# Patient Record
Sex: Female | Born: 1990 | Race: Black or African American | Hispanic: No | Marital: Single | Smoking: Never smoker
Health system: Southern US, Community
[De-identification: ages and names within clinical notes are randomized; demographics above are authoritative.]

## PROBLEM LIST (undated history)

## (undated) DIAGNOSIS — B009 Herpesviral infection, unspecified: Secondary | ICD-10-CM

## (undated) HISTORY — PX: THERAPEUTIC ABORTION: SHX798

## (undated) HISTORY — PX: NO PAST SURGERIES: SHX2092

---
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
## 2013-08-08 ENCOUNTER — Inpatient Hospital Stay (HOSPITAL_COMMUNITY)
Admission: AD | Admit: 2013-08-08 | Discharge: 2013-08-08 | Disposition: A | Discharge status: Home / Self Care | Payer: 59 | Source: Ambulatory Visit | Attending: Obstetrics and Gynecology | Admitting: Obstetrics and Gynecology

## 2013-08-08 ENCOUNTER — Encounter (HOSPITAL_COMMUNITY): Payer: Self-pay | Admitting: *Deleted

## 2013-08-08 DIAGNOSIS — A499 Bacterial infection, unspecified: Secondary | ICD-10-CM | POA: Insufficient documentation

## 2013-08-08 DIAGNOSIS — B009 Herpesviral infection, unspecified: Secondary | ICD-10-CM

## 2013-08-08 DIAGNOSIS — A6000 Herpesviral infection of urogenital system, unspecified: Secondary | ICD-10-CM | POA: Insufficient documentation

## 2013-08-08 DIAGNOSIS — N760 Acute vaginitis: Secondary | ICD-10-CM | POA: Insufficient documentation

## 2013-08-08 DIAGNOSIS — B9689 Other specified bacterial agents as the cause of diseases classified elsewhere: Secondary | ICD-10-CM | POA: Insufficient documentation

## 2013-08-08 DIAGNOSIS — R109 Unspecified abdominal pain: Secondary | ICD-10-CM | POA: Insufficient documentation

## 2013-08-08 LAB — URINALYSIS, ROUTINE W REFLEX MICROSCOPIC
Bilirubin Urine: NEGATIVE
Glucose, UA: NEGATIVE mg/dL
Ketones, ur: NEGATIVE mg/dL
Leukocytes, UA: NEGATIVE
Nitrite: NEGATIVE
Protein, ur: NEGATIVE mg/dL
Specific Gravity, Urine: 1.025 (ref 1.005–1.030)
Urobilinogen, UA: 0.2 mg/dL (ref 0.0–1.0)
pH: 6 (ref 5.0–8.0)

## 2013-08-08 LAB — URINE MICROSCOPIC-ADD ON

## 2013-08-08 LAB — WET PREP, GENITAL
Trich, Wet Prep: NONE SEEN
Yeast Wet Prep HPF POC: NONE SEEN

## 2013-08-08 LAB — POCT PREGNANCY, URINE: Preg Test, Ur: NEGATIVE

## 2013-08-08 MED ORDER — AZITHROMYCIN 500 MG PO TABS: 1000.0000 mg | ORAL_TABLET | Freq: Once | ORAL | Status: AC

## 2013-08-08 MED ORDER — VALACYCLOVIR HCL 1 G PO TABS: 1000.0000 mg | ORAL_TABLET | Freq: Two times a day (BID) | ORAL | Status: AC

## 2013-08-08 MED ORDER — METRONIDAZOLE 500 MG PO TABS: 500.0000 mg | ORAL_TABLET | Freq: Two times a day (BID) | ORAL | Status: AC

## 2013-08-08 MED ORDER — LIDOCAINE HCL 2 % EX GEL: CUTANEOUS | Status: DC

## 2013-08-08 NOTE — MAU Note (Signed)
Patient presents with left sided groin pain X 2 days.

## 2013-08-08 NOTE — MAU Provider Note (Signed)
  History     CSN: [id number]  Arrival date and time: 08/08/13 2015      Chief Complaint  Patient presents with  . Groin Pain   HPI Pt reports vaginal tenderness since Monday, which has developed into severe pain.  States she went to [facility] yesterday, but they were unable to perform a speculum exam due to pain 10/10.  They sent labs for STD screen, but she is unsure which were included and they gave her a Rx for valtrex, which she has not filled.  Also states she has a painful lump on her left groin since last night and vaginal spotting for a few days and moderate thin d/c.  Denies fever, chills, HA, thick vaginal d/c or itching.  Pt states that she has been having sex for 2 yrs with an on-again-off-again partner.  She denies taking anything for pain. LMP 2 weeks ago per pt.   Past Medical History  Diagnosis Date  . Medical history non-contributory   HPV+  Past Surgical History  Procedure Laterality Date  . No past surgeries      Family History  Problem Relation Age of Onset  . Hypertension Mother   . Depression Mother   . Hypertension Father   . Depression Father     History  Substance Use Topics  . Smoking status: Never Smoker   . Smokeless tobacco: Never Used  . Alcohol Use: Yes    Allergies: No Known Allergies  No prescriptions prior to admission    ROS GU + for vaginal spotting, thin d/c with possible odor, severe vaginal and pelvic pain/burning.  Denies abd pain, urinary urgency/frequency, fever, chills, HA, vaginal itching.   Physical Exam   Blood pressure 125/71, pulse 94, temperature 97.8 F (36.6 C), temperature source Oral, resp. rate 16, height 5' (1.524 m), weight 49.159 kg (108 lb 6 oz), last menstrual period 07/25/2013.  Physical Exam Gen--Visibly uncomfortable Abd--Non-tender, non-distended Groin--Left inguinal lymphadenopathy and tenderness Ext genitalia--~1cm red lesion under introitus, painful to  palpation Vagina--multiple vesicles noted w/ copious, foul-smelling d/c and scant bleeding; tender to palpation   MAU Course  Procedures UPT neg GC/Chlam Wet prep--moderate clue cells  Waiting on records from urgent care Assessment and Plan  1.) HSV   Valtrex x 7-10 days  Topical lidocaine gel prn  Encourage supportive measures--soaking in the tub, using water w/ urination for dilution  2.) Bacterial vaginosis  Metronidazole 500 mg x 7 days  3.) Multiple STDs--possible cervicitis   Azithromycin 1g once  F/U in clinic in 2 weeks and repeat STD testing.  Cyndee Brightly 08/08/2013, 9:59 PM

## 2013-08-09 NOTE — MAU Provider Note (Signed)
Seen at time of service with [facility] - agree with management plan  Marlinda Mike CNM [facility]

## 2013-08-11 LAB — GC/CHLAMYDIA PROBE AMP
CT Probe RNA: POSITIVE — AB
GC Probe RNA: NEGATIVE

## 2013-10-01 ENCOUNTER — Encounter (HOSPITAL_COMMUNITY): Payer: Self-pay | Admitting: *Deleted

## 2013-10-01 ENCOUNTER — Inpatient Hospital Stay (HOSPITAL_COMMUNITY)
Admission: AD | Admit: 2013-10-01 | Discharge: 2013-10-01 | Disposition: A | Discharge status: Home / Self Care | Payer: 59 | Source: Ambulatory Visit | Attending: Obstetrics | Admitting: Obstetrics

## 2013-10-01 DIAGNOSIS — S335XXA Sprain of ligaments of lumbar spine, initial encounter: Principal | ICD-10-CM

## 2013-10-01 DIAGNOSIS — T148XXA Other injury of unspecified body region, initial encounter: Secondary | ICD-10-CM

## 2013-10-01 DIAGNOSIS — S339XXA Sprain of unspecified parts of lumbar spine and pelvis, initial encounter: Secondary | ICD-10-CM | POA: Insufficient documentation

## 2013-10-01 DIAGNOSIS — M549 Dorsalgia, unspecified: Secondary | ICD-10-CM | POA: Insufficient documentation

## 2013-10-01 DIAGNOSIS — A6000 Herpesviral infection of urogenital system, unspecified: Secondary | ICD-10-CM | POA: Insufficient documentation

## 2013-10-01 HISTORY — DX: Herpesviral infection, unspecified: B00.9

## 2013-10-01 LAB — URINE MICROSCOPIC-ADD ON

## 2013-10-01 LAB — URINALYSIS, ROUTINE W REFLEX MICROSCOPIC
Bilirubin Urine: NEGATIVE
Glucose, UA: NEGATIVE mg/dL
Ketones, ur: NEGATIVE mg/dL
Nitrite: NEGATIVE
Protein, ur: NEGATIVE mg/dL
Specific Gravity, Urine: 1.015 (ref 1.005–1.030)
Urobilinogen, UA: 0.2 mg/dL (ref 0.0–1.0)
pH: 6.5 (ref 5.0–8.0)

## 2013-10-01 LAB — POCT PREGNANCY, URINE: Preg Test, Ur: NEGATIVE

## 2013-10-01 LAB — WET PREP, GENITAL
Trich, Wet Prep: NONE SEEN
Yeast Wet Prep HPF POC: NONE SEEN

## 2013-10-01 MED ORDER — VALACYCLOVIR HCL 500 MG PO TABS: 500.0000 mg | ORAL_TABLET | Freq: Two times a day (BID) | ORAL | Status: AC

## 2013-10-01 NOTE — MAU Note (Signed)
Pt reports she stated having back pain since Monday. Painstarts in he left side and radiated towrd her lower back. . Stated feels numb and burning

## 2013-10-01 NOTE — MAU Provider Note (Signed)
History     CSN: [id number]  Arrival date and time: 10/01/13 1612   None     Chief Complaint  Patient presents with  . Back Pain   HPI Comments: C/o back pain that started 3 days ago and worsening tonight. Feels tight and tingling, more sensitive near the spine. Been busy over the last few days, driving back and forth to school. Doesn't recall any injury. Hx of HSV and +CMT in 07/2013, no follow-up. Denies intercourse since. Last office visit 11/2009.   Back Pain This is a new problem. The current episode started in the past 7 days. The problem occurs constantly. The problem has been gradually worsening since onset. The pain is present in the lumbar spine. The quality of the pain is described as aching. The pain does not radiate. The pain is moderate. Pertinent negatives include no abdominal pain, bladder incontinence, dysuria, fever, pelvic pain or weakness.    Pertinent Gynecological History: Menses: flow is light, LMP 09/27/13 Bleeding: normal Contraception: condoms DES exposure: denies Blood transfusions: none Sexually transmitted diseases: recent diagnosis: HSV and CMT (08/08/2013) Previous GYN Procedures: n/a  Last mammogram: n/a Date: n/a Last pap: WNL Date: unsure   Past Medical History  Diagnosis Date  . Herpes     Past Surgical History  Procedure Laterality Date  . No past surgeries      Family History  Problem Relation Age of Onset  . Hypertension Mother   . Depression Mother   . Hypertension Father   . Depression Father     History  Substance Use Topics  . Smoking status: Never Smoker   . Smokeless tobacco: Never Used  . Alcohol Use: No    Allergies: No Known Allergies  No prescriptions prior to admission    Review of Systems  Constitutional: Negative.  Negative for fever and chills.  HENT: Negative.   Eyes: Negative.   Respiratory: Negative.   Cardiovascular: Negative.   Gastrointestinal: Negative.  Negative for abdominal pain, diarrhea and  constipation.  Genitourinary: Negative.  Negative for bladder incontinence, dysuria, urgency, frequency, hematuria, flank pain and pelvic pain.  Musculoskeletal: Positive for back pain.  Skin: Negative.   Neurological: Negative.  Negative for weakness.  Endo/Heme/Allergies: Negative.   Psychiatric/Behavioral: Negative.   GU: no VB, lesions, or discharge  Physical Exam   Blood pressure 119/75, pulse 76, temperature 98.1 F (36.7 C), temperature source Oral, resp. rate 16, height 5\' 4"  (1.626 m), weight 48.716 kg (107 lb 6.4 oz), last menstrual period 09/27/2013.  Physical Exam  Constitutional: She is oriented to person, place, and time. She appears well-developed and well-nourished.  HENT:  Head: Normocephalic.  Neck: Normal range of motion. Neck supple.  Cardiovascular: Normal rate and regular rhythm.   Respiratory: Effort normal and breath sounds normal.  GI: Soft.  Genitourinary: Vagina normal.  Speculum: brown thin non-odorous discharge No lesions GC/CML culture and wet prep obtained  Musculoskeletal: Normal range of motion.  Mild paravertebral tenderness. CVAT absent.  Neurological: She is alert and oriented to person, place, and time.  Skin: Skin is warm and dry.    MAU Course  Procedures UA-heme (likely contamination, recent menses)- culture sent UPT-neg Wet prep-few clue GC/CML culture pending  MDM n/a  Assessment and Plan  Musculoskeletal Strain  Discharge home.  Recommend Ibuprofen 400mg  q 6 hrs x 2-3 days, no heavy lifting, and warm baths to reduce inflammation. Valtrex for recurrent outbreaks Follow-up with PCP Follow-up with Dr. Cherly Hensenousins for annual exam  Rosamond Andress, N 10/01/2013, 7:22 PM

## 2013-10-01 NOTE — Discharge Instructions (Signed)
Muscle Strain  Muscle strain occurs when a muscle is stretched beyond its normal length. A small number of muscle fibers generally are torn. This is especially common in athletes. This happens when a sudden, violent force placed on a muscle stretches it too far. Usually, recovery from muscle strain takes 1 to 2 weeks. Complete healing will take 5 to 6 weeks.   HOME CARE INSTRUCTIONS    While awake, apply ice to the sore muscle for the first 2 days after the injury.   Put ice in a plastic bag.   Place a towel between your skin and the bag.   Leave the ice on for 15-20 minutes each hour.   Do not use the strained muscle for several days, until you no longer have pain.   You may wrap the injured area with an elastic bandage for comfort. Be careful not to wrap it too tightly. This may interfere with blood circulation or increase swelling.   Only take over-the-counter or prescription medicines for pain, discomfort, or fever as directed by your caregiver.  SEEK MEDICAL CARE IF:   You have increasing pain or swelling in the injured area.  MAKE SURE YOU:    Understand these instructions.   Will watch your condition.   Will get help right away if you are not doing well or get worse.  Document Released: 09/10/2005 Document Revised: 12/03/2011 Document Reviewed: 09/22/2011  ExitCare Patient Information 2014 [employer].

## 2013-10-02 LAB — GC/CHLAMYDIA PROBE AMP
CT Probe RNA: NEGATIVE
GC Probe RNA: NEGATIVE

## 2014-10-04 ENCOUNTER — Encounter (HOSPITAL_COMMUNITY): Payer: Self-pay | Admitting: *Deleted

## 2014-10-04 ENCOUNTER — Inpatient Hospital Stay (HOSPITAL_COMMUNITY)
Admission: AD | Admit: 2014-10-04 | Discharge: 2014-10-04 | Disposition: A | Discharge status: Home / Self Care | Payer: 59 | Source: Ambulatory Visit | Attending: Family Medicine | Admitting: Family Medicine

## 2014-10-04 DIAGNOSIS — Z3A00 Weeks of gestation of pregnancy not specified: Secondary | ICD-10-CM | POA: Diagnosis not present

## 2014-10-04 DIAGNOSIS — O035 Genital tract and pelvic infection following complete or unspecified spontaneous abortion: Secondary | ICD-10-CM | POA: Diagnosis not present

## 2014-10-04 DIAGNOSIS — R109 Unspecified abdominal pain: Secondary | ICD-10-CM | POA: Diagnosis present

## 2014-10-04 DIAGNOSIS — N939 Abnormal uterine and vaginal bleeding, unspecified: Secondary | ICD-10-CM

## 2014-10-04 DIAGNOSIS — O074 Failed attempted termination of pregnancy without complication: Secondary | ICD-10-CM

## 2014-10-04 LAB — CBC
HCT: 32.8 % — ABNORMAL LOW (ref 36.0–46.0)
Hemoglobin: 11.2 g/dL — ABNORMAL LOW (ref 12.0–15.0)
MCH: 31.5 pg (ref 26.0–34.0)
MCHC: 34.1 g/dL (ref 30.0–36.0)
MCV: 92.1 fL (ref 78.0–100.0)
Platelets: 267 10*3/uL (ref 150–400)
RBC: 3.56 MIL/uL — ABNORMAL LOW (ref 3.87–5.11)
RDW: 11.9 % (ref 11.5–15.5)
WBC: 9.6 10*3/uL (ref 4.0–10.5)

## 2014-10-04 NOTE — MAU Provider Note (Signed)
History     CSN: [id number]  Arrival date and time: 10/04/14 1409   First Provider Initiated Contact with Patient 10/04/14 [phone]      Chief Complaint  Patient presents with  . Vaginal Bleeding   The history is provided by the patient.     Ms. Candice Villa is a 24 y.o. female, G1P0010 who presents to the MAU with complaints of abdominal cramping and bleeding post medical abortion started 2 days ago. States she was approximately [redacted]wks pregnant and was seen at [facility] [place] on Saturday and started the regimen the same day. This morning took the cytotec and percocet for cramps, and came to visit a friend at the [facility]. During the visit became dizzy, lightheaded, and syncopal and was directed by the floor nurses to the MAU. Reports increasing abdominal cramps since Saturday and heavy vaginal bleeding (reports soaking through pad in 30 min), as well as nausea, and diarrhea. Denies any urinary symptoms, constipation, headache, vision changes, fevers, chills or malaise. Reports diagnosis last week of BV at urgent care, but has not taken prescribed Flagyl as vaginal DC and discomfort have improved on their own.    Past Medical History  Diagnosis Date  . Herpes     Past Surgical History  Procedure Laterality Date  . No past surgeries    . Therapeutic abortion      Family History  Problem Relation Age of Onset  . Hypertension Mother   . Depression Mother   . Hypertension Father   . Depression Father     History  Substance Use Topics  . Smoking status: Never Smoker   . Smokeless tobacco: Never Used  . Alcohol Use: No    Allergies: No Known Allergies  Prescriptions prior to admission  Medication Sig Dispense Refill Last Dose  . Multiple Vitamins-Minerals (HAIR/SKIN/NAILS PO) Take 1 capsule by mouth daily.   Past Week at Unknown time  . valACYclovir (VALTREX) 1000 MG tablet Take 1,000 mg by mouth once.   Past Week at Unknown time     Review of Systems  Constitutional: Negative for fever, chills and malaise/fatigue.  Eyes: Negative for blurred vision and double vision.  Cardiovascular: Negative for chest pain.  Gastrointestinal: Positive for nausea, vomiting, abdominal pain (cramping) and diarrhea. Negative for constipation.  Genitourinary: Negative for dysuria, urgency and frequency.       + vaginal d/c 2/2 BV, but improved over the past 5 days + vaginal bleeding  Neurological: Positive for dizziness. Negative for loss of consciousness and headaches.   Physical Exam   Blood pressure 100/61, pulse 99, temperature 98.1 F (36.7 C), resp. rate 18, height 5' (1.524 m), weight 103 lb (46.72 kg), SpO2 100 %.  Physical Exam  Vitals reviewed. Constitutional: She is oriented to person, place, and time. She appears well-developed and well-nourished. No distress.  HENT:  Head: Normocephalic and atraumatic.  Neck: Normal range of motion.  Cardiovascular: Normal rate.   Respiratory: Effort normal.  GI: Soft. Bowel sounds are normal. She exhibits no distension and no mass. There is tenderness (suprapubic tenderness). There is no rebound and no guarding.  Genitourinary: There is no rash, tenderness, lesion or injury on the right labia. There is no rash, tenderness, lesion or injury on the left labia. Uterus is tender. Uterus is not enlarged. Cervix exhibits discharge (bleeding and fragments of pink tissue protruding from the cervix). Cervix exhibits no motion tenderness and no friability. Right adnexum displays no mass,  no tenderness and no fullness. Left adnexum displays no mass, no tenderness and no fullness. There is tenderness (marked discomfort during the exam) and bleeding (moderate amount of dark blood present in the vaginal vault, with moderate amount of small clots and round pieces of soft, light pink tissue) in the vagina. No foreign body around the vagina. No signs of injury around the vagina.  Cervix posterior.    Neurological: She is alert and oriented to person, place, and time.  Skin: Skin is warm and dry.    MAU Course  Procedures None  MDM CBC: mild decrease in Hgb/Hct  Results for orders placed or performed during the hospital encounter of 10/04/14 (from the past 24 hour(s))  CBC     Status: Abnormal   Collection Time: 10/04/14  3:59 PM  Result Value Ref Range   WBC 9.6 4.0 - 10.5 K/uL   RBC 3.56 (L) 3.87 - 5.11 MIL/uL   Hemoglobin 11.2 (L) 12.0 - 15.0 g/dL   HCT 16.1 (L) 09.6 - 04.5 %   MCV 92.1 78.0 - 100.0 fL   MCH 31.5 26.0 - 34.0 pg   MCHC 34.1 30.0 - 36.0 g/dL   RDW 40.9 81.1 - 91.4 %   Platelets 267 150 - 400 K/uL   Patient Vitals for the past 24 hrs:  BP Temp Pulse Resp SpO2 Height Weight  10/04/14 1702 100/61 mmHg - 99 - - - -  10/04/14 1701 102/58 mmHg - 81 - - - -  10/04/14 1659 107/61 mmHg - 86 18 - - -  10/04/14 1541 99/69 mmHg - 68 - - - -  10/04/14 1540 102/62 mmHg - 68 - - - -  10/04/14 1539 (!) 83/68 mmHg - 75 - - - -  10/04/14 1528 99/59 mmHg - 75 - - - -  10/04/14 1440 (!) 94/43 mmHg 98.1 F (36.7 C) 83 18 100 % 5' (1.524 m) 103 lb (46.72 kg)   Expected sxs following Cytotec were reviewed with the patient.  She states she came to MAU because she didn't realize what would happen after taking med.   At time of discharge, patient asked if she could wait for her ride down the hall with her friend who had a baby.  I told her I was uncomfortable with her doing that, and it would be my advice to wait for her ride in the room in MAU, get dinner, go home and take her Percocet.  Instructed to report worsening pain or heavy bleeding.  Patient's orthostatic BPs were wnl and she was feeling better.  She agreed to go plan to go home, take med and rest.  E. Key, RN, FNP  Assessment and Plan  1. Ongoing medical abortion.  Discussed with patient expected progress of a therapeutic abortion and need for rest during this time. Counseled on a need to f[facility] with [facility]  as [facility]. Already has Rx for OCP, counseled on appropriate dosing. Seek emergent care should bleeding significantly increase, filling a heavy flow pad every 30 min x2 hrs, or any episodes of syncope. Once TBA complete supplement with iron or prenatal vitamins. Take Percocet she already has prn.  Pelvic rest until bleeding has stopped, OCPs begun and rechecked at [facility].   2.  Hx of Dx of Bacterial Vaginosis-1 week ago-untreated Complete Flagyl treatment prescribed for BV last week to decrease chance of uterine infection.     Misior, Marlana Salvage 10/04/2014 5:03 PM   The note was reviewed,  I observed the student's exam and agree with her plan of care.

## 2014-10-04 NOTE — MAU Note (Signed)
Took last cyctotec given for TBA by [facility] , started  Saturday, [facility] and had near syncope episode while in [place] and states filled pad in 30 min.  Pad applied before AC brought her to MAU, pad less than 1/2 full at this time

## 2014-10-04 NOTE — MAU Note (Signed)
Pt presents to MAU for syncopal episode after taking cytotec this morning for termination of pregnancy at [redacted] weeks gestation. Reports vaginal bleeding started after so took cytotec. States she also took percocet for pain.

## 2014-10-04 NOTE — Discharge Instructions (Signed)
Incomplete Miscarriage A miscarriage is the sudden loss of an unborn baby (fetus) before the 20th week of pregnancy. In an incomplete miscarriage, parts of the fetus or placenta (afterbirth) remain in the body.  Having a miscarriage can be an emotional experience. Talk with your health care provider about any questions you may have about miscarrying, the grieving process, and your future pregnancy plans. CAUSES   Problems with the fetal chromosomes that make it impossible for the baby to develop normally. Problems with the baby's genes or chromosomes are most often the result of errors that occur by chance as the embryo divides and grows. The problems are not inherited from the parents.  Infection of the cervix or uterus.  Hormone problems.  Problems with the cervix, such as having an incompetent cervix. This is when the tissue in the cervix is not strong enough to hold the pregnancy.  Problems with the uterus, such as an abnormally shaped uterus, uterine fibroids, or congenital abnormalities.  Certain medical conditions.  Smoking, drinking alcohol, or taking illegal drugs.  Trauma. SYMPTOMS   Vaginal bleeding or spotting, with or without cramps or pain.  Pain or cramping in the abdomen or lower back.  Passing fluid, tissue, or blood clots from the vagina. DIAGNOSIS  Your health care provider will perform a physical exam. You may also have an ultrasound to confirm the miscarriage. Blood or urine tests may also be ordered. TREATMENT   Usually, a dilation and curettage (D&C) procedure is performed. During a D&C procedure, the cervix is widened (dilated) and any remaining fetal or placental tissue is gently removed from the uterus.  Antibiotic medicines are prescribed if there is an infection. Other medicines may be given to reduce the size of the uterus (contract) if there is a lot of bleeding.  If you have Rh negative blood and your baby was Rh positive, you will need a Rho (D)  immune globulin shot. This shot will protect any future baby from having Rh blood problems in future pregnancies.  You may be confined to bed rest. This means you should stay in bed and only get up to use the bathroom. HOME CARE INSTRUCTIONS   Rest as directed by your health care provider.  Restrict activity as directed by your health care provider. You may be allowed to continue light activity if curettage was not done but you require further treatment.  Keep track of the number of pads you use each day. Keep track of how soaked (saturated) they are. Record this information.  Do not  use tampons.  Do not douche or have sexual intercourse until approved by your health care provider.  Keep all follow-up appointments for reevaluation and continuing management.  Only take over-the-counter or prescription medicines for pain, fever, or discomfort as directed by your health care provider.  Take antibiotic medicine as directed by your health care provider. Make sure you finish it even if you start to feel better. SEEK IMMEDIATE MEDICAL CARE IF:   You experience severe cramps in your stomach, back, or abdomen.  You have an unexplained temperature (make sure to record these temperatures).  You pass large clots or tissue (save these for your health care provider to inspect).  Your bleeding increases.  You become light-headed, weak, or have fainting episodes. MAKE SURE YOU:   Understand these instructions.  Will watch your condition.  Will get help right away if you are not doing well or get worse. Document Released: 09/10/2005 Document Revised: 01/25/2014 Document Reviewed:   04/09/2013 Exit[employer] Patient Information 2015 [employer]. This information is not intended to replace advice given to you by your health care provider. Make sure you discuss any questions you have with your health care provider.  

## 2016-06-21 DIAGNOSIS — H5213 Myopia, bilateral: Secondary | ICD-10-CM | POA: Diagnosis not present

## 2020-02-05 ENCOUNTER — Other Ambulatory Visit: Payer: Self-pay

## 2020-02-05 ENCOUNTER — Encounter: Payer: Self-pay | Admitting: Emergency Medicine

## 2020-02-05 ENCOUNTER — Ambulatory Visit
Admission: EM | Admit: 2020-02-05 | Discharge: 2020-02-05 | Disposition: A | Discharge status: Home / Self Care | Payer: 59 | Attending: Physician Assistant | Admitting: Physician Assistant

## 2020-02-05 DIAGNOSIS — N898 Other specified noninflammatory disorders of vagina: Secondary | ICD-10-CM | POA: Insufficient documentation

## 2020-02-05 MED ORDER — FLUCONAZOLE 150 MG PO TABS: 150.0000 mg | ORAL_TABLET | Freq: Every day | ORAL | 0 refills | Status: DC

## 2020-02-05 NOTE — Discharge Instructions (Signed)
You were treated empirically for yeast. Diflucan as directed. Continue valtrex. Cytology sent, you will be contacted with any positive results that requires further treatment. Refrain from sexual activity and alcohol use for the next 7 days. Monitor for any worsening of symptoms, fever, abdominal pain, nausea, vomiting, to follow up for reevaluation

## 2020-02-05 NOTE — ED Triage Notes (Addendum)
Pt here for yellow vaginal discharge and swelling in vaginal area x 3 days; pt sts swelling is different than normal herpes outbreak but is taking meds for herpes currently

## 2020-02-05 NOTE — ED Provider Notes (Signed)
[facility]    CSN: [id number] Arrival date & time: 02/05/20  1052      History   Chief Complaint Chief Complaint  Patient presents with  . Vaginal Discharge    HPI Candice Villa is a 29 y.o. female.   29 year old female comes in for 3 day history of vaginal discharge, swelling to the groin area. Vaginal itching. 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 Denies urinary symptoms such as frequency, dysuria, hematuria. Denies fever, chills, body aches. Denies abdominal pain, nausea, vomiting. LMP 01/22/2020. Sexually active one female partner, no condom use.      Past Medical History:  Diagnosis Date  . Herpes     There are no problems to display for this patient.   Past Surgical History:  Procedure Laterality Date  . NO PAST SURGERIES    . THERAPEUTIC ABORTION      OB History    Gravida  1   Para      Term      Preterm      AB  1   Living  0     SAB      TAB  1   Ectopic      Multiple      Live Births               Home Medications    Prior to Admission medications   Medication Sig Start Date End Date Taking? Authorizing Provider  fluconazole (DIFLUCAN) 150 MG tablet Take 1 tablet (150 mg total) by mouth daily. Take second dose 72 hours later if symptoms still persists. 02/05/20   Cathie Hoops, Niranjan Rufener V, PA-C  Multiple Vitamins-Minerals (HAIR/SKIN/NAILS PO) Take 1 capsule by mouth daily.    [provider]  valACYclovir (VALTREX) 1000 MG tablet Take 1,000 mg by mouth once.    [provider]    Family History Family History  Problem Relation Age of Onset  . Hypertension Mother   . Depression Mother   . Hypertension Father   . Depression Father     Social History Social History   Tobacco Use  . Smoking status: Never Smoker  . Smokeless tobacco: Never Used  Substance Use Topics  . Alcohol use: No  . Drug use: No     Allergies   Patient has no known allergies.   Review of  Systems Review of Systems  Reason unable to perform ROS: See HPI as above.     Physical Exam Triage Vital Signs ED Triage Vitals [02/05/20 1100]  Enc Vitals Group     BP (!) 142/83     Pulse Rate (!) 106     Resp 18     Temp 98.4 F (36.9 C)     Temp Source Oral     SpO2 96 %     Weight      Height      Head Circumference      Peak Flow      Pain Score 5     Pain Loc      Pain Edu?      Excl. in GC?    No data found.  Updated Vital Signs BP (!) 142/83 (BP Location: Right Arm)   Pulse (!) 106   Temp 98.4 F (36.9 C) (Oral)   Resp 18   SpO2 96%   Visual Acuity Right Eye Distance:   Left Eye Distance:   Bilateral Distance:  Right Eye Near:   Left Eye Near:    Bilateral Near:     Physical Exam Exam conducted with a chaperone present.  Constitutional:      General: She is not in acute distress.    Appearance: Normal appearance. She is well-developed. She is not toxic-appearing or diaphoretic.  HENT:     Head: Normocephalic and atraumatic.  Eyes:     Conjunctiva/sclera: Conjunctivae normal.     Pupils: Pupils are equal, round, and reactive to light.  Pulmonary:     Effort: Pulmonary effort is normal. No respiratory distress.     Comments: Speaking in full sentences without difficulty Genitourinary:    Comments: No swelling, erythema, rash to the labia.   Erythema to the vaginal opening, no obvious discharge. No obvious rashes seen.  Musculoskeletal:     Cervical back: Normal range of motion and neck supple.  Skin:    General: Skin is warm and dry.  Neurological:     Mental Status: She is alert and oriented to person, place, and time.      UC Treatments / Results  Labs (all labs ordered are listed, but only abnormal results are displayed) Labs Reviewed  CERVICOVAGINAL ANCILLARY ONLY    EKG   Radiology No results found.  Procedures Procedures (including critical care time)  Medications Ordered in UC Medications - No data to  display  Initial Impression / Assessment and Plan / UC Course  I have reviewed the triage vital signs and the nursing notes.  Pertinent labs & imaging results that were available during my care of the patient were reviewed by me and considered in my medical decision making (see chart for details).    No rashes, lesions, ulcerations noted. Will cover for yeast with diflucan. Can continue valtrex for possible flare up. Cytology sent, patient will be contacted with any positive results that require additional treatment. Patient to refrain from sexual activity for the next 7 days. Return precautions given.   Final Clinical Impressions(s) / UC Diagnoses   Final diagnoses:  Vaginal discharge   ED Prescriptions    Medication Sig Dispense Auth. Provider   fluconazole (DIFLUCAN) 150 MG tablet Take 1 tablet (150 mg total) by mouth daily. Take second dose 72 hours later if symptoms still persists. 2 tablet Ok Edwards, PA-C     PDMP not reviewed this encounter.   Ok Edwards, PA-C 02/05/20 1118

## 2020-02-08 ENCOUNTER — Telehealth (HOSPITAL_COMMUNITY): Payer: Self-pay | Admitting: Orthopedic Surgery

## 2020-02-08 LAB — CERVICOVAGINAL ANCILLARY ONLY
Bacterial Vaginitis (gardnerella): POSITIVE — AB
Chlamydia: NEGATIVE
Comment: NEGATIVE
Comment: NEGATIVE
Comment: NEGATIVE
Comment: NORMAL
Neisseria Gonorrhea: NEGATIVE
Trichomonas: NEGATIVE

## 2020-02-08 MED ORDER — METRONIDAZOLE 500 MG PO TABS
500.0000 mg | ORAL_TABLET | Freq: Two times a day (BID) | ORAL | 0 refills | Status: DC
Start: 2020-02-08 — End: 2020-08-04

## 2020-03-01 ENCOUNTER — Ambulatory Visit: Admission: EM | Admit: 2020-03-01 | Discharge: 2020-03-01 | Discharge status: Absent without leave | Payer: Self-pay

## 2020-03-01 ENCOUNTER — Other Ambulatory Visit: Payer: Self-pay

## 2020-06-24 ENCOUNTER — Other Ambulatory Visit: Payer: Self-pay

## 2020-06-24 ENCOUNTER — Encounter: Payer: Self-pay | Admitting: Emergency Medicine

## 2020-06-24 ENCOUNTER — Ambulatory Visit
Admission: EM | Admit: 2020-06-24 | Discharge: 2020-06-24 | Disposition: A | Discharge status: Home / Self Care | Payer: Self-pay | Attending: Emergency Medicine | Admitting: Emergency Medicine

## 2020-06-24 DIAGNOSIS — L739 Follicular disorder, unspecified: Secondary | ICD-10-CM

## 2020-06-24 MED ORDER — DOXYCYCLINE HYCLATE 100 MG PO CAPS: 100.0000 mg | ORAL_CAPSULE | Freq: Two times a day (BID) | ORAL | 0 refills | Status: AC

## 2020-06-24 NOTE — Discharge Instructions (Signed)
Begin daily cetirizine/Zyrtec or loratadine/Claritin to help with postnasal drainage Doxycycline twice daily to treat early abscess/folliculitis Warm compresses to armpits Tylenol and ibuprofen as needed for discomfort Follow-up if any symptoms not improving or worsening

## 2020-06-24 NOTE — ED Triage Notes (Signed)
Pt here for possible swollen lymph nodes in bilateral axillary area; pt sts some nasal congestion

## 2020-06-25 NOTE — ED Provider Notes (Signed)
[facility]    CSN: [id number] Arrival date & time: 06/24/20  1937      History   Chief Complaint Chief Complaint  Patient presents with   swollen lymph nodes    HPI Candice Villa is a 29 y.o. female presenting today for evaluation of areas of swelling to her axilla.  Patient reports that earlier in the week she had some slight aching in bilateral armpit areas.  Since she has developed some areas of swelling which she is unsure of possible lymph nodes versus abscesses.  She has had history of prior abscesses.  She does report that a friend recently got diagnosed with lymphoma and she is concerned about cancer being because of this.  She has had some slight congestion cough.  Denies any chest pain, does report slight shortness of breath that began today. Denies cough or sore throat.  She does report that she does use tobacco and vapes.  Denies prior DVT/PE.  Denies leg pain or leg swelling.  Denies recent travel or immobilization.  HPI  Past Medical History:  Diagnosis Date   Herpes     There are no problems to display for this patient.   Past Surgical History:  Procedure Laterality Date   NO PAST SURGERIES     THERAPEUTIC ABORTION      OB History    Gravida  1   Para      Term      Preterm      AB  1   Living  0     SAB      TAB  1   Ectopic      Multiple      Live Births               Home Medications    Prior to Admission medications   Medication Sig Start Date End Date Taking? Authorizing Provider  doxycycline (VIBRAMYCIN) 100 MG capsule Take 1 capsule (100 mg total) by mouth 2 (two) times daily for 7 days. 06/24/20 07/01/20  Jaki Steptoe C, PA-C  fluconazole (DIFLUCAN) 150 MG tablet Take 1 tablet (150 mg total) by mouth daily. Take second dose 72 hours later if symptoms still persists. Patient not taking: Reported on 06/24/2020 02/05/20   Belinda Fisher, PA-C  metroNIDAZOLE (FLAGYL) 500 MG tablet Take 1 tablet (500 mg total) by  mouth 2 (two) times daily. Patient not taking: Reported on 06/24/2020 02/08/20   Merrilee Jansky, MD  Multiple Vitamins-Minerals (HAIR/SKIN/NAILS PO) Take 1 capsule by mouth daily.    [provider]  valACYclovir (VALTREX) 1000 MG tablet Take 1,000 mg by mouth once.    [provider]    Family History Family History  Problem Relation Age of Onset   Hypertension Mother    Depression Mother    Hypertension Father    Depression Father     Social History Social History   Tobacco Use   Smoking status: Never Smoker   Smokeless tobacco: Never Used  Substance Use Topics   Alcohol use: No   Drug use: No     Allergies   Patient has no known allergies.   Review of Systems Review of Systems  Constitutional: Negative for activity change, appetite change, chills, fatigue and fever.  HENT: Positive for congestion, postnasal drip and rhinorrhea. Negative for ear pain, sinus pressure, sore throat and trouble swallowing.   Eyes: Negative for discharge and redness.  Respiratory: Negative for cough, chest tightness and  shortness of breath.   Cardiovascular: Negative for chest pain.  Gastrointestinal: Negative for abdominal pain, diarrhea, nausea and vomiting.  Musculoskeletal: Negative for myalgias.  Skin: Positive for color change. Negative for rash.  Neurological: Negative for dizziness, light-headedness and headaches.     Physical Exam Triage Vital Signs ED Triage Vitals  Enc Vitals Group     BP 06/24/20 2026 125/76     Pulse Rate 06/24/20 2026 (!) 103     Resp 06/24/20 2026 18     Temp 06/24/20 2026 99.1 F (37.3 C)     Temp Source 06/24/20 2026 Oral     SpO2 06/24/20 2026 96 %     Weight --      Height --      Head Circumference --      Peak Flow --      Pain Score 06/24/20 2027 4     Pain Loc --      Pain Edu? --      Excl. in GC? --    No data found.  Updated Vital Signs BP 125/76 (BP Location: Left Arm)    Pulse (!) 103    Temp 99.1  F (37.3 C) (Oral)    Resp 18    SpO2 96%   Visual Acuity Right Eye Distance:   Left Eye Distance:   Bilateral Distance:    Right Eye Near:   Left Eye Near:    Bilateral Near:     Physical Exam Vitals and nursing note reviewed.  Constitutional:      Appearance: She is well-developed.     Comments: No acute distress  HENT:     Head: Normocephalic and atraumatic.     Ears:     Comments: Bilateral ears without tenderness to palpation of external auricle, tragus and mastoid, EAC's without erythema or swelling, TM's with good bony landmarks and cone of light. Non erythematous.     Nose: Nose normal.     Mouth/Throat:     Comments: Oral mucosa pink and moist, no tonsillar enlargement or exudate. Posterior pharynx patent and nonerythematous, no uvula deviation or swelling. Normal phonation. Mucus present in posterior pharynx Eyes:     Conjunctiva/sclera: Conjunctivae normal.  Neck:     Comments: No lymphadenopathy present Cardiovascular:     Rate and Rhythm: Normal rate.  Pulmonary:     Effort: Pulmonary effort is normal. No respiratory distress.     Comments: Breathing comfortably at rest, CTABL, no wheezing, rales or other adventitious sounds auscultated Abdominal:     General: There is no distension.  Musculoskeletal:        General: Normal range of motion.     Cervical back: Neck supple.  Skin:    General: Skin is warm and dry.     Comments: Bilateral axilla with small superficial areas of induration with slight erythema and tenderness  No lymph nodes palpated within axilla bilaterally  Neurological:     Mental Status: She is alert and oriented to person, place, and time.      UC Treatments / Results  Labs (all labs ordered are listed, but only abnormal results are displayed) Labs Reviewed - No data to display  EKG   Radiology No results found.  Procedures Procedures (including critical care time)  Medications Ordered in UC Medications - No data to  display  Initial Impression / Assessment and Plan / UC Course  I have reviewed the triage vital signs and the nursing notes.  Pertinent labs &  imaging results that were available during my care of the patient were reviewed by me and considered in my medical decision making (see chart for details).     Areas to the axilla suggestive of folliculitis versus early abscess, treating with doxycycline warm compresses.  Initiated on daily antihistamine to help with postnasal drainage.  Lungs clear.  Low suspicion of malignant process at this time, but continue to monitor areas and axilla as well as breathing.  Discussed strict return precautions. Patient verbalized understanding and is agreeable with plan.  Final Clinical Impressions(s) / UC Diagnoses   Final diagnoses:  Folliculitis     Discharge Instructions     Begin daily cetirizine/Zyrtec or loratadine/Claritin to help with postnasal drainage Doxycycline twice daily to treat early abscess/folliculitis Warm compresses to armpits Tylenol and ibuprofen as needed for discomfort Follow-up if any symptoms not improving or worsening   ED Prescriptions    Medication Sig Dispense Auth. Provider   doxycycline (VIBRAMYCIN) 100 MG capsule Take 1 capsule (100 mg total) by mouth 2 (two) times daily for 7 days. 14 capsule Michaelangelo Mittelman, [place] C, PA-C     PDMP not reviewed this encounter.   Lew Dawes, [place] 06/25/20 1335

## 2020-08-04 ENCOUNTER — Encounter: Payer: Self-pay | Admitting: Emergency Medicine

## 2020-08-04 ENCOUNTER — Other Ambulatory Visit: Payer: Self-pay

## 2020-08-04 ENCOUNTER — Ambulatory Visit
Admission: EM | Admit: 2020-08-04 | Discharge: 2020-08-04 | Disposition: A | Discharge status: Home / Self Care | Payer: Self-pay | Attending: Emergency Medicine | Admitting: Emergency Medicine

## 2020-08-04 DIAGNOSIS — Z7251 High risk heterosexual behavior: Secondary | ICD-10-CM | POA: Insufficient documentation

## 2020-08-04 DIAGNOSIS — Z113 Encounter for screening for infections with a predominantly sexual mode of transmission: Secondary | ICD-10-CM | POA: Insufficient documentation

## 2020-08-04 NOTE — ED Triage Notes (Signed)
Pt here for STD screening; denies sx  

## 2020-08-04 NOTE — Discharge Instructions (Signed)

## 2020-08-04 NOTE — ED Provider Notes (Signed)
[facility]    CSN: [id number] Arrival date & time: 08/04/20  1242      History   Chief Complaint Chief Complaint  Patient presents with  . Exposure to STD    HPI Candice Villa is a 29 y.o. female  Resenting for STD testing: Denying symptoms such as pelvic pain, vaginal discharge, urinary symptoms or fever.  No known exposures.  Currently sexually active with both men and women, more than 1 partner during the last 6 months.  Does not wear condoms routinely.  Past Medical History:  Diagnosis Date  . Herpes     There are no problems to display for this patient.   Past Surgical History:  Procedure Laterality Date  . NO PAST SURGERIES    . THERAPEUTIC ABORTION      OB History    Gravida  1   Para      Term      Preterm      AB  1   Living  0     SAB      TAB  1   Ectopic      Multiple      Live Births               Home Medications    Prior to Admission medications   Medication Sig Start Date End Date Taking? Authorizing Provider  Multiple Vitamins-Minerals (HAIR/SKIN/NAILS PO) Take 1 capsule by mouth daily.    [provider]  valACYclovir (VALTREX) 1000 MG tablet Take 1,000 mg by mouth once.    [provider]    Family History Family History  Problem Relation Age of Onset  . Hypertension Mother   . Depression Mother   . Hypertension Father   . Depression Father     Social History Social History   Tobacco Use  . Smoking status: Never Smoker  . Smokeless tobacco: Never Used  Substance Use Topics  . Alcohol use: No  . Drug use: No     Allergies   Patient has no known allergies.   Review of Systems Review of Systems  Constitutional: Negative for fatigue and fever.  Respiratory: Negative for cough and shortness of breath.   Cardiovascular: Negative for chest pain and palpitations.  Gastrointestinal: Negative for constipation and diarrhea.  Genitourinary: Negative for dysuria, flank pain,  frequency, hematuria, pelvic pain, urgency, vaginal bleeding, vaginal discharge and vaginal pain.     Physical Exam Triage Vital Signs ED Triage Vitals [08/04/20 1253]  Enc Vitals Group     BP 121/78     Pulse Rate 92     Resp 18     Temp 98.2 F (36.8 C)     Temp Source Oral     SpO2 95 %     Weight      Height      Head Circumference      Peak Flow      Pain Score 0     Pain Loc      Pain Edu?      Excl. in GC?    No data found.  Updated Vital Signs BP 121/78 (BP Location: Left Arm)   Pulse 92   Temp 98.2 F (36.8 C) (Oral)   Resp 18   SpO2 95%   Visual Acuity Right Eye Distance:   Left Eye Distance:   Bilateral Distance:    Right Eye Near:   Left Eye Near:    Bilateral Near:  Physical Exam Constitutional:      General: She is not in acute distress. HENT:     Head: Normocephalic and atraumatic.  Eyes:     General: No scleral icterus.    Pupils: Pupils are equal, round, and reactive to light.  Cardiovascular:     Rate and Rhythm: Normal rate.  Pulmonary:     Effort: Pulmonary effort is normal.  Abdominal:     General: Bowel sounds are normal.     Palpations: Abdomen is soft.     Tenderness: There is no abdominal tenderness. There is no right CVA tenderness, left CVA tenderness or guarding.  Genitourinary:    Comments: Patient declined, self-swab performed Skin:    Coloration: Skin is not jaundiced or pale.  Neurological:     Mental Status: She is alert and oriented to person, place, and time.      UC Treatments / Results  Labs (all labs ordered are listed, but only abnormal results are displayed) Labs Reviewed  CERVICOVAGINAL ANCILLARY ONLY    EKG   Radiology No results found.  Procedures Procedures (including critical care time)  Medications Ordered in UC Medications - No data to display  Initial Impression / Assessment and Plan / UC Course  I have reviewed the triage vital signs and the nursing notes.  Pertinent labs &  imaging results that were available during my care of the patient were reviewed by me and considered in my medical decision making (see chart for details).     Cytology pending: We will treat if indicated.  Return precautions discussed, pt verbalized understanding and is agreeable to plan. Final Clinical Impressions(s) / UC Diagnoses   Final diagnoses:  Screening examination for venereal disease  Unprotected sex     Discharge Instructions     Testing for chlamydia, gonorrhea, trichomonas is pending: please look for these results on the MyChart app/website.  We will notify you if you are positive and outline treatment at that time.  Important to avoid all forms of sexual intercourse (oral, vaginal, anal) with any/all partners for the next 7 days to avoid spreading/reinfecting. Any/all sexual partners should be notified of testing/treatment today.  Return for persistent/worsening symptoms or if you develop fever, abdominal or pelvic pain, discharge, genital pain, blood in your urine, or are re-exposed to an STI.    ED Prescriptions    None     PDMP not reviewed this encounter.  Hall-Potvin, [place], [place] 08/04/20 1315

## 2020-08-05 LAB — CERVICOVAGINAL ANCILLARY ONLY
Chlamydia: NEGATIVE
Comment: NEGATIVE
Comment: NEGATIVE
Comment: NORMAL
Neisseria Gonorrhea: NEGATIVE
Trichomonas: NEGATIVE

## 2020-09-05 ENCOUNTER — Ambulatory Visit
Admission: RE | Admit: 2020-09-05 | Discharge: 2020-09-05 | Disposition: A | Discharge status: Home / Self Care | Payer: Self-pay | Source: Ambulatory Visit | Attending: Emergency Medicine | Admitting: Emergency Medicine

## 2020-09-05 ENCOUNTER — Other Ambulatory Visit: Payer: Self-pay

## 2020-09-05 VITALS — BP 119/67 | HR 104 | Temp 98.6°F | Resp 15

## 2020-09-05 DIAGNOSIS — N898 Other specified noninflammatory disorders of vagina: Secondary | ICD-10-CM | POA: Insufficient documentation

## 2020-09-05 DIAGNOSIS — Z7251 High risk heterosexual behavior: Secondary | ICD-10-CM | POA: Insufficient documentation

## 2020-09-05 MED ORDER — CEFTRIAXONE SODIUM 500 MG IJ SOLR
500.0000 mg | Freq: Once | INTRAMUSCULAR | Status: AC
  Administered 2020-09-05: 19:00:00 500 mg via INTRAMUSCULAR

## 2020-09-05 MED ORDER — DOXYCYCLINE HYCLATE 100 MG PO CAPS: 100.0000 mg | ORAL_CAPSULE | Freq: Two times a day (BID) | ORAL | 0 refills | Status: AC

## 2020-09-05 NOTE — ED Provider Notes (Signed)
[facility]    CSN: [id number] Arrival date & time: 09/05/20  1747      History   Chief Complaint Chief Complaint  Patient presents with  . Vaginal Discharge    HPI Candice Villa is a 29 y.o. female   Sending for STI testing.  Endorsing thick, yellow vaginal discharge without pain or urinary symptoms.  Currently sexual active, not routinely using condoms.  No known exposures.  Past Medical History:  Diagnosis Date  . Herpes     There are no problems to display for this patient.   Past Surgical History:  Procedure Laterality Date  . NO PAST SURGERIES    . THERAPEUTIC ABORTION      OB History    Gravida  1   Para      Term      Preterm      AB  1   Living  0     SAB      IAB  1   Ectopic      Multiple      Live Births               Home Medications    Prior to Admission medications   Medication Sig Start Date End Date Taking? Authorizing Provider  doxycycline (VIBRAMYCIN) 100 MG capsule Take 1 capsule (100 mg total) by mouth 2 (two) times daily for 7 days. 09/05/20 09/12/20  Hall-Potvin, [place], PA-C  Multiple Vitamins-Minerals (HAIR/SKIN/NAILS PO) Take 1 capsule by mouth daily.    [provider]  valACYclovir (VALTREX) 1000 MG tablet Take 1,000 mg by mouth once.    [provider]    Family History Family History  Problem Relation Age of Onset  . Hypertension Mother   . Depression Mother   . Hypertension Father   . Depression Father     Social History Social History   Tobacco Use  . Smoking status: Never Smoker  . Smokeless tobacco: Never Used  Substance Use Topics  . Alcohol use: No  . Drug use: No     Allergies   Patient has no known allergies.   Review of Systems Review of Systems  Constitutional: Negative for fatigue and fever.  Respiratory: Negative for cough and shortness of breath.   Cardiovascular: Negative for chest pain and palpitations.  Gastrointestinal: Negative for  constipation and diarrhea.  Genitourinary: Positive for vaginal discharge. Negative for dysuria, flank pain, frequency, hematuria, pelvic pain, urgency, vaginal bleeding and vaginal pain.     Physical Exam Triage Vital Signs ED Triage Vitals [09/05/20 1902]  Enc Vitals Group     BP      Pulse      Resp      Temp      Temp src      SpO2      Weight      Height      Head Circumference      Peak Flow      Pain Score 0     Pain Loc      Pain Edu?      Excl. in GC?    No data found.  Updated Vital Signs BP 119/67 (BP Location: Left Arm)   Pulse (!) 104   Temp 98.6 F (37 C) (Oral)   Resp 15   LMP 08/18/2020   SpO2 98%   Visual Acuity Right Eye Distance:   Left Eye Distance:   Bilateral Distance:    Right Eye  Near:   Left Eye Near:    Bilateral Near:     Physical Exam Constitutional:      General: She is not in acute distress. HENT:     Head: Normocephalic and atraumatic.  Eyes:     General: No scleral icterus.    Pupils: Pupils are equal, round, and reactive to light.  Cardiovascular:     Rate and Rhythm: Normal rate.  Pulmonary:     Effort: Pulmonary effort is normal.  Abdominal:     General: Bowel sounds are normal.     Palpations: Abdomen is soft.     Tenderness: There is no abdominal tenderness. There is no right CVA tenderness, left CVA tenderness or guarding.  Genitourinary:    Comments: Patient declined, self-swab performed Skin:    Coloration: Skin is not jaundiced or pale.  Neurological:     Mental Status: She is alert and oriented to person, place, and time.      UC Treatments / Results  Labs (all labs ordered are listed, but only abnormal results are displayed) Labs Reviewed  HIV ANTIBODY (ROUTINE TESTING W REFLEX)  RPR  CERVICOVAGINAL ANCILLARY ONLY    EKG   Radiology No results found.  Procedures Procedures (including critical care time)  Medications Ordered in UC Medications  cefTRIAXone (ROCEPHIN) injection 500 mg  (has no administration in time range)    Initial Impression / Assessment and Plan / UC Course  I have reviewed the triage vital signs and the nursing notes.  Pertinent labs & imaging results that were available during my care of the patient were reviewed by me and considered in my medical decision making (see chart for details).     Otology pending: Patient resting history of STI, states it feels similar.  Requesting treatment: Given Rocephin in office which he tolerated well.  Will start doxycycline today.  Cytology pending.  Denies history of HIV, syphilis.  Return precautions discussed, pt verbalized understanding and is agreeable to plan. Final Clinical Impressions(s) / UC Diagnoses   Final diagnoses:  Vaginal discharge  Unprotected sex     Discharge Instructions     Today you received treatment for chlamydia and gonorrhea. Testing for chlamydia, gonorrhea, trichomonas is pending: please look for these results on the MyChart app/website.  We will notify you if you are positive and outline treatment at that time.  Important to avoid all forms of sexual intercourse (oral, vaginal, anal) with any/all partners for the next 7 days to avoid spreading/reinfecting. Any/all sexual partners should be notified of testing/treatment today.  Return for persistent/worsening symptoms or if you develop fever, abdominal or pelvic pain, discharge, genital pain, blood in your urine, or are re-exposed to an STI.    ED Prescriptions    Medication Sig Dispense Auth. Provider   doxycycline (VIBRAMYCIN) 100 MG capsule Take 1 capsule (100 mg total) by mouth 2 (two) times daily for 7 days. 14 capsule Hall-Potvin, [place], PA-C     PDMP not reviewed this encounter.   Odette Fraction [place], [place] 09/05/20 1908

## 2020-09-05 NOTE — ED Triage Notes (Signed)
Patient presents for STD screening.  Patient c/o abnormal vaginal discharge x 4 days ago.   Patient endorses itching and  "yellow, thick, and a lot" of discharge.   Patient denies any foul order.

## 2020-09-05 NOTE — Discharge Instructions (Addendum)
Today you received treatment for chlamydia and gonorrhea. Testing for chlamydia, gonorrhea, trichomonas is pending: please look for these results on the MyChart app/website.  We will notify you if you are positive and outline treatment at that time.  Important to avoid all forms of sexual intercourse (oral, vaginal, anal) with any/all partners for the next 7 days to avoid spreading/reinfecting. Any/all sexual partners should be notified of testing/treatment today.  Return for persistent/worsening symptoms or if you develop fever, abdominal or pelvic pain, discharge, genital pain, blood in your urine, or are re-exposed to an STI. 

## 2020-09-07 LAB — CERVICOVAGINAL ANCILLARY ONLY
Chlamydia: NEGATIVE
Comment: NEGATIVE
Comment: NEGATIVE
Comment: NORMAL
Neisseria Gonorrhea: NEGATIVE
Trichomonas: NEGATIVE

## 2020-09-08 LAB — RPR: RPR Ser Ql: NONREACTIVE

## 2020-09-08 LAB — HIV ANTIBODY (ROUTINE TESTING W REFLEX): HIV Screen 4th Generation wRfx: NONREACTIVE
